# Patient Record
Sex: Male | Born: 1990
Health system: Southern US, Community
[De-identification: ages and names within clinical notes are randomized; demographics above are authoritative.]

## PROBLEM LIST (undated history)

## (undated) HISTORY — PX: APPENDECTOMY: SHX54

## (undated) HISTORY — PX: HERNIA REPAIR: SHX51

---
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
## 2012-12-18 IMAGING — CT CT ABD-PELV W/ CM
2 of 3 series · 16 of 46 positions shown, 18 images · IV contrast (APPLIED)
Comparison: None.

CLINICAL DATA: Right lower quadrant abdominal pain.  Elevated white
blood count.  Nausea and vomiting.  Fever.

CT ABDOMEN AND PELVIS WITH CONTRAST
TECHNIQUE: Multidetector CT imaging of the abdomen and pelvis was
performed following the standard protocol during bolus
administration of intravenous contrast.
Contrast:  100 ml of Fmnipaque-5OO

[Series 2: abd/pelvis 5.0 b31f · axial · 0.72mm/px · z∈[-513,-58]mm · 13 of 105 slices shown, 15 images]
[im 7/105  soft-tissue]
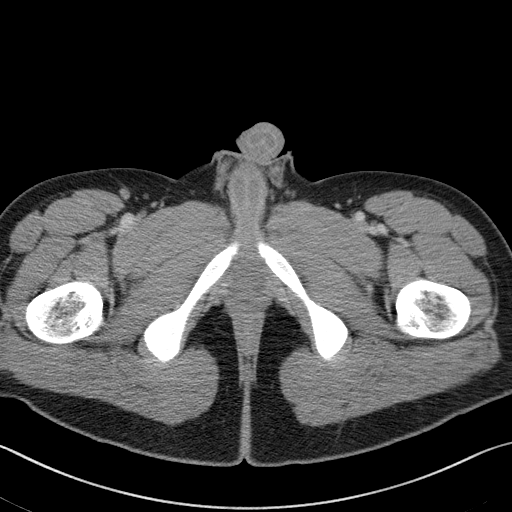
[im 7/105  bone]
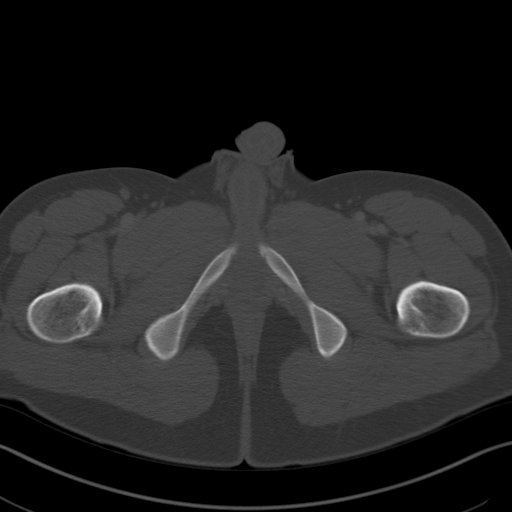
[im 14/105  soft-tissue]
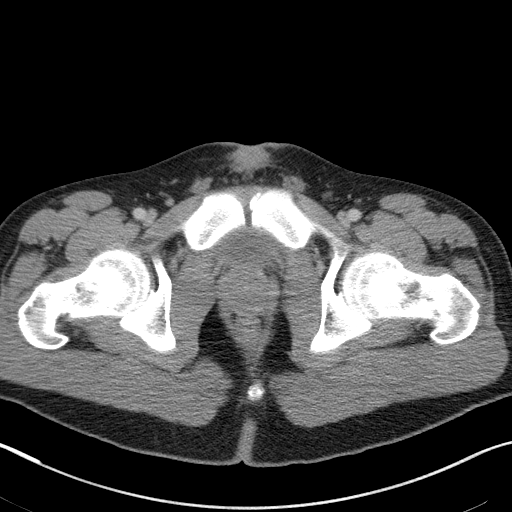
[im 21/105  soft-tissue]
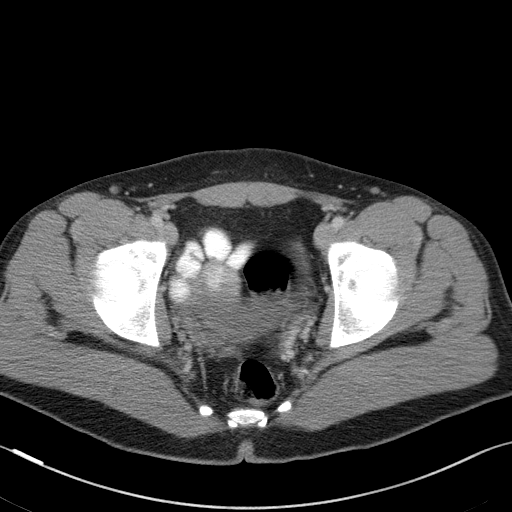
[im 31/105  soft-tissue]
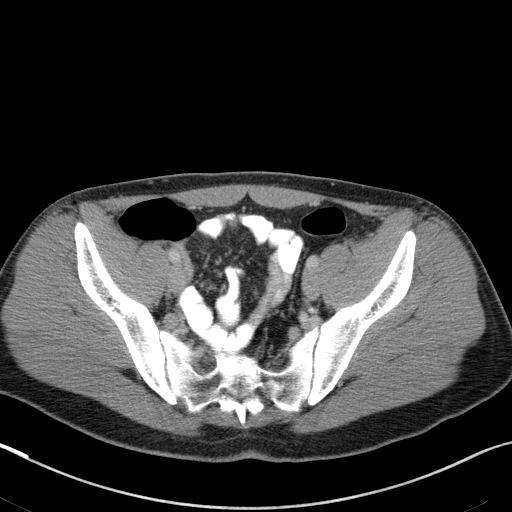
[im 37/105  soft-tissue]
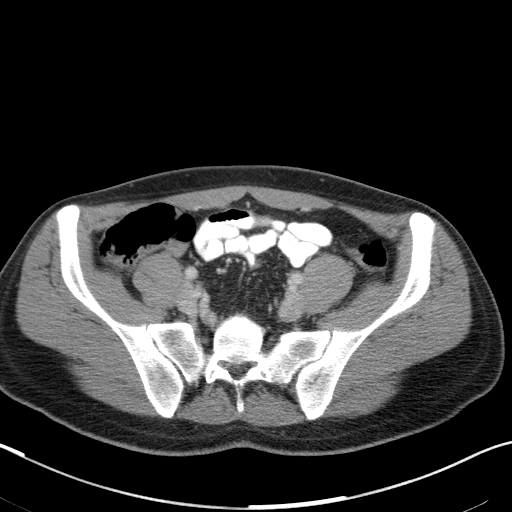
[im 44/105  soft-tissue]
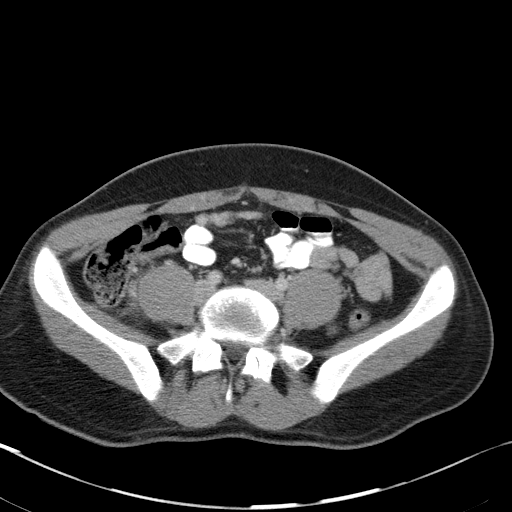
[im 54/105  soft-tissue]
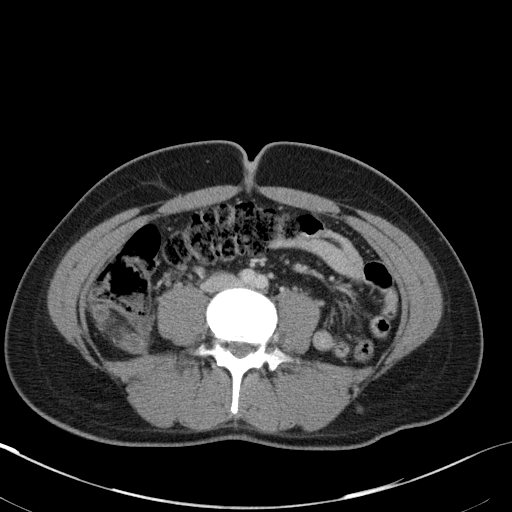
[im 61/105  soft-tissue]
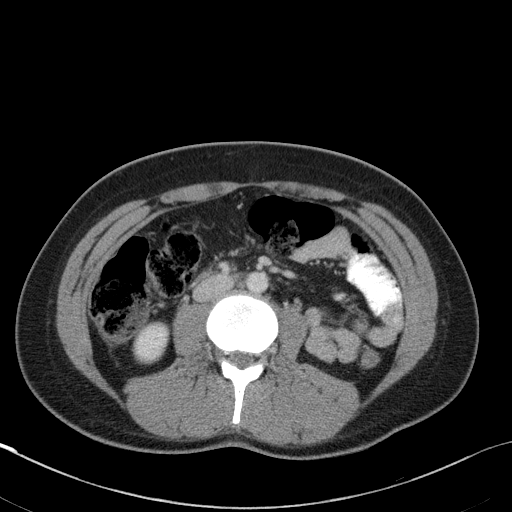
[im 68/105  soft-tissue]
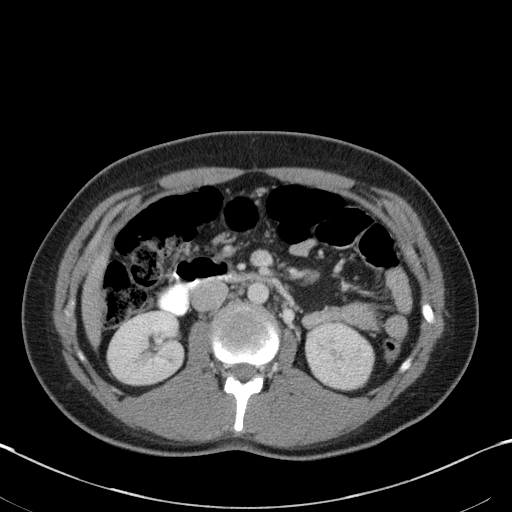
[im 68/105  bone]
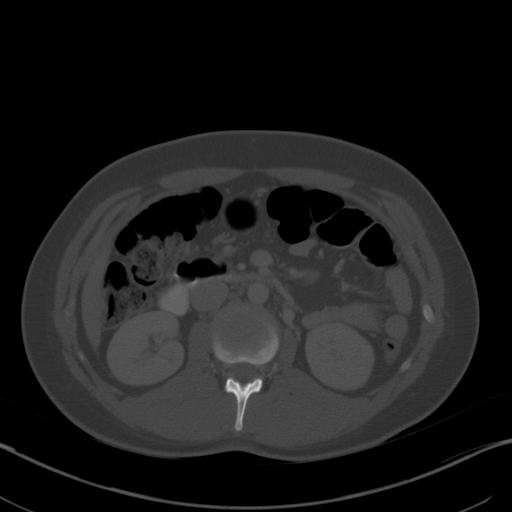
[im 74/105  soft-tissue]
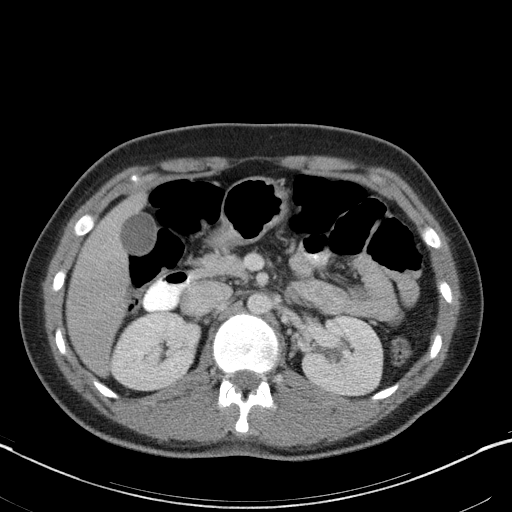
[im 84/105  soft-tissue]
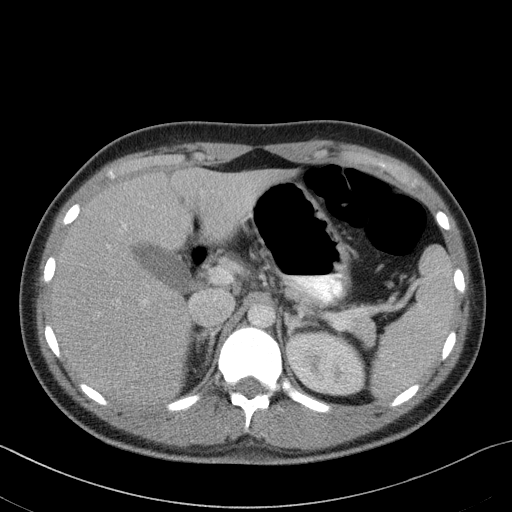
[im 91/105  soft-tissue]
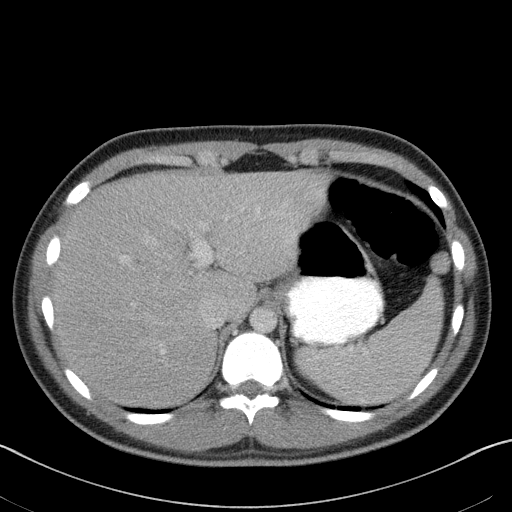
[im 98/105  soft-tissue]
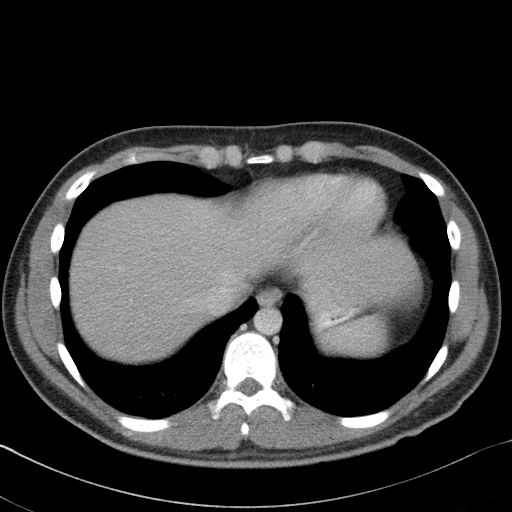

[Series 5: abd/pelvis 3.0 coronal · coronal · 0.94mm/px · 3 of 73 slices shown]
[im 25/73  soft-tissue]
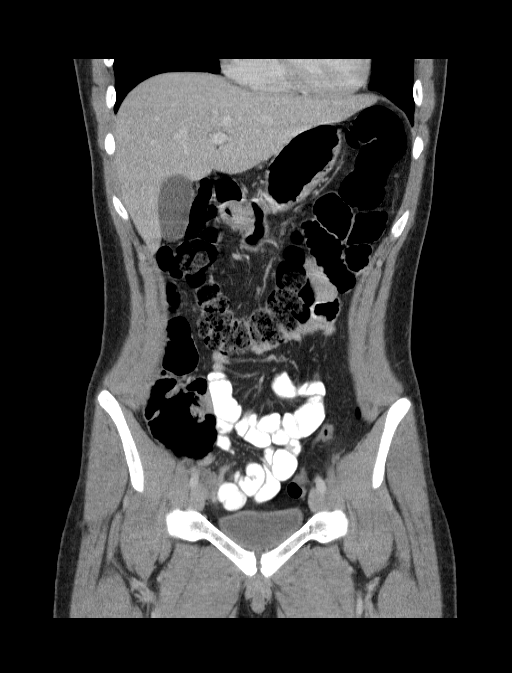
[im 33/73  soft-tissue]
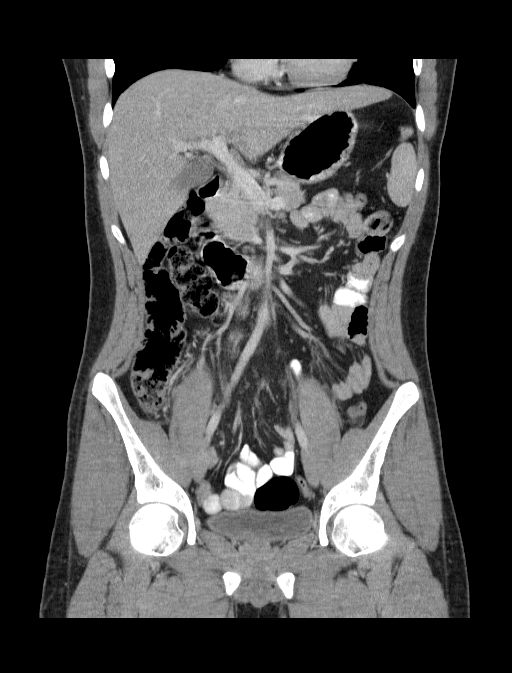
[im 41/73  soft-tissue]
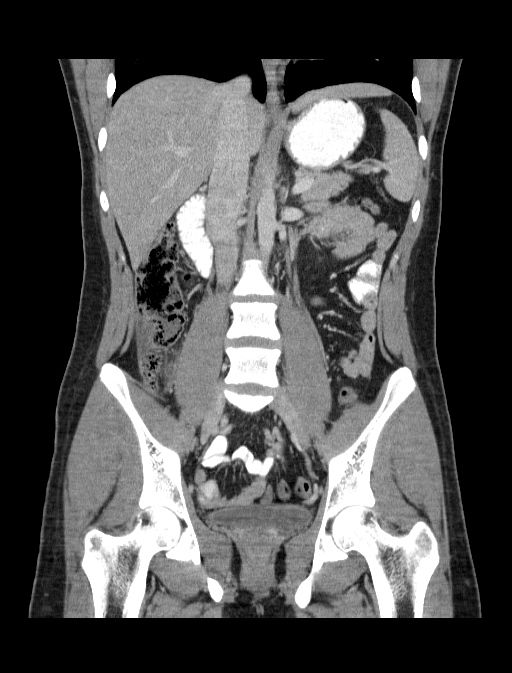

[16 of 46 positions shown; findings below may reference images not displayed]

FINDINGS: The patient has acute appendicitis with numerous
phleboliths and extensive periappendiceal inflammation with fluid
in the right pericolic gutter.  There is air within the distended
abnormal appendix.  No periappendiceal abscess or perforation.

The liver, spleen, pancreas, adrenal glands, and kidneys are
normal.  No free air or free fluid.  No acute osseous abnormality.
IMPRESSION: Acute appendicitis with prominent periappendiceal inflammation.

Critical Value/emergent results were called by telephone at the
time of interpretation on 05/18/2011  at [DATE] p.m.  to  Dr. Meron,
who verbally acknowledged these results.

## 2017-03-09 ENCOUNTER — Ambulatory Visit (HOSPITAL_COMMUNITY)
Admission: EM | Admit: 2017-03-09 | Discharge: 2017-03-09 | Disposition: A | Discharge status: Home / Self Care | Payer: 59 | Attending: Family Medicine | Admitting: Family Medicine

## 2017-03-09 ENCOUNTER — Encounter (HOSPITAL_COMMUNITY): Payer: Self-pay | Admitting: Emergency Medicine

## 2017-03-09 DIAGNOSIS — R197 Diarrhea, unspecified: Secondary | ICD-10-CM | POA: Diagnosis not present

## 2017-03-09 DIAGNOSIS — E860 Dehydration: Secondary | ICD-10-CM | POA: Diagnosis not present

## 2017-03-09 DIAGNOSIS — R112 Nausea with vomiting, unspecified: Secondary | ICD-10-CM

## 2017-03-09 MED ORDER — ONDANSETRON HCL 4 MG PO TABS: 4.0000 mg | ORAL_TABLET | Freq: Four times a day (QID) | ORAL | 0 refills | Status: AC

## 2017-03-09 NOTE — ED Provider Notes (Signed)
[facility]    CSN: [id number] Arrival date & time: 03/09/17  1146     History   Chief Complaint Chief Complaint  Patient presents with  . Emesis    HPI Xavier Johnson is a 26 y.o. male.   26 year old male comes in with mother for a one-day history of diarrhea, nausea, vomiting. He denies any abdominal pain, blood in stool, blood in urine. Denies urinary frequency, dysuria, hematuria. He has been able to keep little water down. But otherwise has not been able to eat. Denies any food intake that could be causing the problem, denies recent travel, recent antibiotic use. He also has bilateral upper and lower extremity but bites, where he states it was due to fleas from a friend's pet. 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 Patient denies chest pain, shortness of breath, wheezing, trouble swallowing, swelling of the throat. Denies fever, chills, night sweats. Endorses some dizziness and weakness when he stands.      Past Medical History:  Diagnosis Date  . Acute appendicitis with gangrene 07/21/2011    Patient Active Problem List   Diagnosis Date Noted  . Acute appendicitis with gangrene 07/21/2011    Past Surgical History:  Procedure Laterality Date  . HERNIA REPAIR    . LAPAROSCOPIC APPENDECTOMY  07/18/2011   Procedure: APPENDECTOMY LAPAROSCOPIC;  Surgeon: Ardeth Sportsman, MD;  Location: [facility];  Service: General;  Laterality: N/A;       Home Medications    Prior to Admission medications   Medication Sig Start Date End Date Taking? Authorizing Provider  ondansetron (ZOFRAN) 4 MG tablet Take 1 tablet (4 mg total) by mouth every 6 (six) hours. 03/09/17   Belinda Fisher, PA-C    Family History No family history on file.  Social History Social History  Substance Use Topics  . Smoking status: Never Smoker  . Smokeless tobacco: Not on file  . Alcohol use No     Allergies     Patient has no known allergies.   Review of Systems Review of Systems  Reason unable to perform ROS: See HPI as above.     Physical Exam Triage Vital Signs ED Triage Vitals  Enc Vitals Group     BP 03/09/17 1239 119/71     Pulse Rate 03/09/17 1239 (!) 103     Resp 03/09/17 1239 20     Temp 03/09/17 1237 98.3 F (36.8 C)     Temp Source 03/09/17 1237 Oral     SpO2 03/09/17 1239 100 %     Weight --      Height --      Head Circumference --      Peak Flow --      Pain Score 03/09/17 1236 7     Pain Loc --      Pain Edu? --      Excl. in [place]? --    Orthostatic VS for the past 24 hrs:  BP- Lying Pulse- Lying BP- Sitting Pulse- Sitting BP- Standing at 0 minutes Pulse- Standing at 0 minutes  03/09/17 1345 130/69 98 131/71 108 106/43 124    Updated Vital Signs BP 132/63 (BP Location: Left Arm)   Pulse (!) 107   Temp 99.3 F (37.4 C) (Oral)   Resp 16   SpO2 99%    Physical Exam  Constitutional: He is oriented to person, place, and time. He appears  well-developed and well-nourished. No distress.  HENT:  Head: Normocephalic and atraumatic.  Eyes: Pupils are equal, round, and reactive to light. Conjunctivae are normal.  Cardiovascular: Regular rhythm and normal heart sounds.  Tachycardia present.  Exam reveals no gallop and no friction rub.   No murmur heard. Pulmonary/Chest: Effort normal and breath sounds normal. No respiratory distress. He has no wheezes. He has no rales.  Abdominal: Soft. Bowel sounds are normal. He exhibits no distension and no mass. There is no tenderness. There is no rebound and no guarding.  Neurological: He is alert and oriented to person, place, and time.  Skin: Skin is warm and dry.     UC Treatments / Results  Labs (all labs ordered are listed, but only abnormal results are displayed) Labs Reviewed - No data to display  EKG  EKG Interpretation None       Radiology No results found.  Procedures Procedures (including critical  care time)  Medications Ordered in UC Medications - No data to display   Initial Impression / Assessment and Plan / UC Course  I have reviewed the triage vital signs and the nursing notes.  Pertinent labs & imaging results that were available during my care of the patient were reviewed by me and considered in my medical decision making (see chart for details).  Clinical Course as of Mar 10 1455  Tue Mar 09, 2017  1410 Patient orthostatics positive for dehydration. By mouth challenge in office. Patient states without nausea right now, and would like to defer Zofran treatment for now.   [AY]    Clinical Course User Index [AY] Belinda Fisher, PA-C    Patient was able to tolerate by mouth intake. Was sent home with Zofran for nausea and vomiting. Patient to push fluids. Patient to wash hands frequently to prevent mouth intake of any chemicals during his work. Return precautions given. Patient expresses understanding and agrees to plan.   Final Clinical Impressions(s) / UC Diagnoses   Final diagnoses:  Nausea vomiting and diarrhea  Dehydration    New Prescriptions New Prescriptions   ONDANSETRON (ZOFRAN) 4 MG TABLET    Take 1 tablet (4 mg total) by mouth every 6 (six) hours.      Belinda Fisher, PA-C 03/09/17 1458

## 2017-03-09 NOTE — ED Triage Notes (Addendum)
Vomiting and diarrhea since 1:00 am.  Vomiting is slowing down, diarrhea continues.  Last vomiting episode around 9:00 am  Covered in insect bites -extremities x 4

## 2017-03-09 NOTE — Discharge Instructions (Signed)
Zofran for nausea and vomiting. Keep hydrated, your urine should be clear to pale yellow in color. Follow-up bland diet for the next few days. Monitor for any worsening of symptoms, abdominal pain, nausea and vomiting not controlled by medicine, blood in stool, blood in vomit, follow up at the emergency department for further evaluation.

## 2020-01-01 ENCOUNTER — Encounter: Payer: Self-pay | Admitting: Emergency Medicine

## 2020-01-01 ENCOUNTER — Ambulatory Visit
Admission: EM | Admit: 2020-01-01 | Discharge: 2020-01-01 | Disposition: A | Discharge status: Other Acute Inpatient Hospital | Payer: BC Managed Care – PPO

## 2020-01-01 ENCOUNTER — Ambulatory Visit
Admission: EM | Admit: 2020-01-01 | Discharge: 2020-01-01 | Disposition: A | Discharge status: Home / Self Care | Payer: Self-pay | Attending: Family Medicine | Admitting: Family Medicine

## 2020-01-01 ENCOUNTER — Ambulatory Visit: Payer: Self-pay

## 2020-01-01 ENCOUNTER — Emergency Department
Admission: EM | Admit: 2020-01-01 | Discharge: 2020-01-01 | Disposition: A | Discharge status: Home / Self Care | Payer: BC Managed Care – PPO | Attending: Student | Admitting: Student

## 2020-01-01 ENCOUNTER — Other Ambulatory Visit: Payer: Self-pay

## 2020-01-01 DIAGNOSIS — L03114 Cellulitis of left upper limb: Secondary | ICD-10-CM

## 2020-01-01 DIAGNOSIS — L02414 Cutaneous abscess of left upper limb: Secondary | ICD-10-CM | POA: Diagnosis not present

## 2020-01-01 DIAGNOSIS — F17210 Nicotine dependence, cigarettes, uncomplicated: Secondary | ICD-10-CM | POA: Insufficient documentation

## 2020-01-01 DIAGNOSIS — L0291 Cutaneous abscess, unspecified: Secondary | ICD-10-CM

## 2020-01-01 LAB — CBC WITH DIFFERENTIAL/PLATELET
Abs Immature Granulocytes: 0.02 10*3/uL (ref 0.00–0.07)
Basophils Absolute: 0 10*3/uL (ref 0.0–0.1)
Basophils Relative: 0 %
Eosinophils Absolute: 0.1 10*3/uL (ref 0.0–0.5)
Eosinophils Relative: 1 %
HCT: 42.4 % (ref 39.0–52.0)
Hemoglobin: 14.8 g/dL (ref 13.0–17.0)
Immature Granulocytes: 0 %
Lymphocytes Relative: 12 %
Lymphs Abs: 1.2 10*3/uL (ref 0.7–4.0)
MCH: 30.1 pg (ref 26.0–34.0)
MCHC: 34.9 g/dL (ref 30.0–36.0)
MCV: 86.4 fL (ref 80.0–100.0)
Monocytes Absolute: 0.6 10*3/uL (ref 0.1–1.0)
Monocytes Relative: 6 %
Neutro Abs: 8.1 10*3/uL — ABNORMAL HIGH (ref 1.7–7.7)
Neutrophils Relative %: 81 %
Platelets: 202 10*3/uL (ref 150–400)
RBC: 4.91 MIL/uL (ref 4.22–5.81)
RDW: 12.6 % (ref 11.5–15.5)
WBC: 10.1 10*3/uL (ref 4.0–10.5)
nRBC: 0 % (ref 0.0–0.2)

## 2020-01-01 LAB — LACTIC ACID, PLASMA: Lactic Acid, Venous: 0.6 mmol/L (ref 0.5–1.9)

## 2020-01-01 LAB — COMPREHENSIVE METABOLIC PANEL
ALT: 18 U/L (ref 0–44)
AST: 17 U/L (ref 15–41)
Albumin: 4.4 g/dL (ref 3.5–5.0)
Alkaline Phosphatase: 50 U/L (ref 38–126)
Anion gap: 9 (ref 5–15)
BUN: 11 mg/dL (ref 6–20)
CO2: 27 mmol/L (ref 22–32)
Calcium: 8.9 mg/dL (ref 8.9–10.3)
Chloride: 105 mmol/L (ref 98–111)
Creatinine, Ser: 0.94 mg/dL (ref 0.61–1.24)
GFR calc Af Amer: >60 mL/min (ref >60)
GFR calc non Af Amer: >60 mL/min (ref >60)
Glucose, Bld: 93 mg/dL (ref 70–99)
Potassium: 4 mmol/L (ref 3.5–5.1)
Sodium: 141 mmol/L (ref 135–145)
Total Bilirubin: 0.9 mg/dL (ref 0.3–1.2)
Total Protein: 7.7 g/dL (ref 6.5–8.1)

## 2020-01-01 MED ORDER — SULFAMETHOXAZOLE-TRIMETHOPRIM 800-160 MG PO TABS
1.0000 | ORAL_TABLET | Freq: Two times a day (BID) | ORAL | 0 refills | Status: DC
Start: 2020-01-01 — End: 2020-01-01

## 2020-01-01 MED ORDER — SODIUM CHLORIDE 0.9% FLUSH: 3.0000 mL | Freq: Once | INTRAVENOUS | Status: DC

## 2020-01-01 MED ORDER — BACITRACIN ZINC 500 UNIT/GM EX OINT
TOPICAL_OINTMENT | Freq: Two times a day (BID) | CUTANEOUS | Status: DC
  Filled 2020-01-01: qty 0.9

## 2020-01-01 MED ORDER — SODIUM CHLORIDE 0.9 % IV SOLN
1.0000 g | Freq: Once | INTRAVENOUS | Status: AC
  Administered 2020-01-01: 1 g via INTRAVENOUS
  Filled 2020-01-01: qty 10

## 2020-01-01 MED ORDER — SULFAMETHOXAZOLE-TRIMETHOPRIM 800-160 MG PO TABS: 1.0000 | ORAL_TABLET | Freq: Two times a day (BID) | ORAL | 0 refills | Status: AC

## 2020-01-01 MED ORDER — CEPHALEXIN 500 MG PO CAPS: 500.0000 mg | ORAL_CAPSULE | Freq: Four times a day (QID) | ORAL | 0 refills | Status: DC

## 2020-01-01 MED ORDER — KETOROLAC TROMETHAMINE 30 MG/ML IJ SOLN
INTRAMUSCULAR | Status: AC
  Administered 2020-01-01: 30 mg via INTRAVENOUS
  Filled 2020-01-01: qty 1

## 2020-01-01 MED ORDER — CEPHALEXIN 500 MG PO CAPS: 500.0000 mg | ORAL_CAPSULE | Freq: Four times a day (QID) | ORAL | 0 refills | Status: AC

## 2020-01-01 MED ORDER — LIDOCAINE-EPINEPHRINE 2 %-1:100000 IJ SOLN
20.0000 mL | Freq: Once | INTRAMUSCULAR | Status: AC
  Administered 2020-01-01: 20 mL
  Filled 2020-01-01: qty 1

## 2020-01-01 MED ORDER — VANCOMYCIN HCL IN DEXTROSE 1-5 GM/200ML-% IV SOLN
1000.0000 mg | Freq: Once | INTRAVENOUS | Status: AC
  Administered 2020-01-01: 1000 mg via INTRAVENOUS
  Filled 2020-01-01: qty 200

## 2020-01-01 MED ORDER — KETOROLAC TROMETHAMINE 30 MG/ML IJ SOLN: 30.0000 mg | Freq: Once | INTRAMUSCULAR | Status: AC

## 2020-01-01 NOTE — ED Provider Notes (Signed)
[facility]    CSN: [id number] Arrival date & time: 01/01/20  1049      History   Chief Complaint Chief Complaint  Patient presents with   Appointment   Cellulitis    HPI MATTHEO SWINDLE is a 29 y.o. male.   Patient presents with an abscess on his left forearm with pain and redness and swelling x3 days.  No known injury.  He thinks it may have been ingrown hair or a pimple.  He has been treating the area with antibiotic ointment.  He denies fever, chills, numbness, weakness, or other symptoms.  The history is provided by the patient.    Past Medical History:  Diagnosis Date   Acute appendicitis with gangrene 07/21/2011    Patient Active Problem List   Diagnosis Date Noted   Acute appendicitis with gangrene 07/21/2011    Past Surgical History:  Procedure Laterality Date   APPENDECTOMY     HERNIA REPAIR     LAPAROSCOPIC APPENDECTOMY  07/18/2011   Procedure: APPENDECTOMY LAPAROSCOPIC;  Surgeon: Adin Hector, MD;  Location: [facility];  Service: General;  Laterality: N/A;       Home Medications    Prior to Admission medications   Medication Sig Start Date End Date Taking? Authorizing Provider  ondansetron (ZOFRAN) 4 MG tablet Take 1 tablet (4 mg total) by mouth every 6 (six) hours. 03/09/17   Ok Edwards, PA-C    Family History Family History  Problem Relation Age of Onset   Healthy Mother    Healthy Father     Social History Social History   Tobacco Use   Smoking status: Current Every Day Smoker    Packs/day: 1.00    Years: 2.00    Pack years: 2.00    Types: Cigarettes   Smokeless tobacco: Never Used  Scientific laboratory technician Use: Never used  Substance Use Topics   Alcohol use: Yes    Comment: occassional   Drug use: No     Allergies   Patient has no known allergies.   Review of Systems Review of Systems  Constitutional: Negative for chills and fever.  HENT: Negative for ear pain and sore throat.   Eyes: Negative for  pain and visual disturbance.  Respiratory: Negative for cough and shortness of breath.   Cardiovascular: Negative for chest pain and palpitations.  Gastrointestinal: Negative for abdominal pain and vomiting.  Genitourinary: Negative for dysuria and hematuria.  Musculoskeletal: Positive for arthralgias and joint swelling. Negative for back pain.  Skin: Positive for color change and wound.  Neurological: Negative for seizures, syncope, weakness and numbness.  All other systems reviewed and are negative.    Physical Exam Triage Vital Signs ED Triage Vitals  Enc Vitals Group     BP 01/01/20 1100 (!) 156/82     Pulse Rate 01/01/20 1100 (!) 54     Resp 01/01/20 1100 18     Temp 01/01/20 1100 98.9 F (37.2 C)     Temp Source 01/01/20 1100 Oral     SpO2 01/01/20 1100 98 %     Weight 01/01/20 1101 250 lb (113.4 kg)     Height 01/01/20 1101 6\' 5"  (1.956 m)     Head Circumference --      Peak Flow --      Pain Score 01/01/20 1100 2     Pain Loc --      Pain Edu? --      Excl. in [place]? --  No data found.  Updated Vital Signs BP (!) 156/82 (BP Location: Right Arm)    Pulse (!) 54    Temp 98.9 F (37.2 C) (Oral)    Resp 18    Ht 6\' 5"  (1.956 m)    Wt 250 lb (113.4 kg)    SpO2 98%    BMI 29.65 kg/m   Visual Acuity Right Eye Distance:   Left Eye Distance:   Bilateral Distance:    Right Eye Near:   Left Eye Near:    Bilateral Near:     Physical Exam Vitals and nursing note reviewed.  Constitutional:      Appearance: He is well-developed.  HENT:     Head: Normocephalic and atraumatic.     Mouth/Throat:     Mouth: Mucous membranes are moist.  Eyes:     Conjunctiva/sclera: Conjunctivae normal.  Cardiovascular:     Rate and Rhythm: Normal rate and regular rhythm.     Heart sounds: No murmur heard.   Pulmonary:     Effort: Pulmonary effort is normal. No respiratory distress.     Breath sounds: Normal breath sounds.  Abdominal:     Palpations: Abdomen is soft.      Tenderness: There is no abdominal tenderness.  Musculoskeletal:        General: Swelling present.     Cervical back: Neck supple.  Skin:    General: Skin is warm and dry.     Findings: Erythema present.     Comments: Left forearm and elbow erythematous and edematous.  22 cm x 12 cm area of erythema with central lesion.  See pictures for details.    Neurological:     General: No focal deficit present.     Mental Status: He is alert and oriented to person, place, and time.     Sensory: No sensory deficit.     Motor: No weakness.     Gait: Gait normal.            UC Treatments / Results  Labs (all labs ordered are listed, but only abnormal results are displayed) Labs Reviewed - No data to display  EKG   Radiology No results found.  Procedures Procedures (including critical care time)  Medications Ordered in UC Medications - No data to display  Initial Impression / Assessment and Plan / UC Course  I have reviewed the triage vital signs and the nursing notes.  Pertinent labs & imaging results that were available during my care of the patient were reviewed by me and considered in my medical decision making (see chart for details).   Cellulitis of left arm.  Sending patient to the ED for evaluation due to the possible need for lab work and IV antibiotics.      Final Clinical Impressions(s) / UC Diagnoses   Final diagnoses:  Cellulitis of left arm     Discharge Instructions     Go to the Emergency Department.        ED Prescriptions    None     PDMP not reviewed this encounter.   , NP 01/01/20 1128

## 2020-01-01 NOTE — ED Triage Notes (Signed)
Patient is being discharged from the Urgent Care and sent to the Emergency Department via POV . Per Wendee Beavers, NP, patient is in need of higher level of care due to septic joint. Patient is aware and verbalizes understanding of plan of care.  Vitals:   01/01/20 1100  BP: (!) 156/82  Pulse: (!) 54  Resp: 18  Temp: 98.9 F (37.2 C)  SpO2: 98%

## 2020-01-01 NOTE — Progress Notes (Signed)
PHARMACY -  BRIEF ANTIBIOTIC NOTE   Pharmacy has received consult(s) for Vancomycin from an ED provider.  The patient's profile has been reviewed for ht/wt/allergies/indication/available labs.    One time order(s) placed for Vancomycin 1g IV x 1 dose  Further antibiotics/pharmacy consults should be ordered by admitting physician if indicated.                       Thank you, Bettey Costa 01/01/2020  2:43 PM

## 2020-01-01 NOTE — Discharge Instructions (Signed)
Thank you for letting us take care of you in the emergency department today.   Please continue to take any regular, prescribed medications.   New medications we have prescribed:  Keflex, antibiotic Bactrim, antibiotic   Please follow up with: A primary care doctor to review your ER visit and follow up on your symptoms. Go for a wound recheck in 2-3 days.   Please return to the ER for any new or worsening symptoms. This includes worsening or spreading redness, worsening pain/swelling, numbness, tingling, pain w/ movement of your elbow, swelling/redness of the elbow, or any other symptoms concerning to you.

## 2020-01-01 NOTE — ED Triage Notes (Signed)
swelling to right fore arm x 3 days. Seen by urgent care and sent to ED for further evaluation.  Area reddened and warm to touch.

## 2020-01-01 NOTE — Discharge Instructions (Addendum)
Go to the Emergency Department. 

## 2020-01-01 NOTE — ED Provider Notes (Signed)
[facility] Emergency Department Provider Note  ____________________________________________   First MD Initiated Contact with Patient 01/01/20 1420     (approximate)  I have reviewed the triage vital signs and the nursing notes.  History  Chief Complaint Abscess    HPI Xavier Johnson is a 29 y.o. male otherwise healthy, who presents to the ER for evaluation cellulitis and possible abscess to the left arm.  Patient states he first noticed a small bump/raised area on Friday.  Since then he has developed spreading erythema and some associated swelling to the forearm and proximal arm.  Some mild associated discomfort, described as a soreness/tightness.  No radiation.  No alleviating/aggravating components.  Denies any known injury, suspects it may have started as an ingrown hair or pimple.  Has been treating it with an antibiotic ointment without improvement.  No fevers, nausea, vomiting.  Has full range of motion at the elbow and wrist without discomfort.  No numbness or tingling.   Past Medical Hx Past Medical History:  Diagnosis Date  . Acute appendicitis with gangrene 07/21/2011    Problem List Patient Active Problem List   Diagnosis Date Noted  . Acute appendicitis with gangrene 07/21/2011    Past Surgical Hx Past Surgical History:  Procedure Laterality Date  . APPENDECTOMY    . HERNIA REPAIR    . LAPAROSCOPIC APPENDECTOMY  07/18/2011   Procedure: APPENDECTOMY LAPAROSCOPIC;  Surgeon: Adin Hector, MD;  Location: [facility];  Service: General;  Laterality: N/A;    Medications Prior to Admission medications   Medication Sig Start Date End Date Taking? Authorizing Provider  ondansetron (ZOFRAN) 4 MG tablet Take 1 tablet (4 mg total) by mouth every 6 (six) hours. 03/09/17   Ok Edwards, PA-C    Allergies Patient has no known allergies.  Family Hx Family History  Problem Relation Age of Onset  . Healthy Mother   . Healthy Father      Social Hx Social History   Tobacco Use  . Smoking status: Current Every Day Smoker    Packs/day: 1.00    Years: 2.00    Pack years: 2.00    Types: Cigarettes  . Smokeless tobacco: Never Used  Vaping Use  . Vaping Use: Never used  Substance Use Topics  . Alcohol use: Yes    Comment: occassional  . Drug use: No     Review of Systems  Constitutional: Negative for fever. Negative for chills. Eyes: Negative for visual changes. ENT: Negative for sore throat. Cardiovascular: Negative for chest pain. Respiratory: Negative for shortness of breath. Gastrointestinal: Negative for nausea. Negative for vomiting.  Genitourinary: Negative for dysuria. Musculoskeletal: Negative for leg swelling. Skin: + cellulitis Neurological: Negative for headaches.   Physical Exam  Vital Signs: ED Triage Vitals  Enc Vitals Group     BP 01/01/20 1306 140/77     Pulse Rate 01/01/20 1306 (!) 57     Resp 01/01/20 1306 18     Temp 01/01/20 1306 99 F (37.2 C)     Temp Source 01/01/20 1306 Oral     SpO2 01/01/20 1306 100 %     Weight 01/01/20 1301 250 lb (113.4 kg)     Height 01/01/20 1301 6\' 5"  (1.956 m)     Head Circumference --      Peak Flow --      Pain Score 01/01/20 1300 3     Pain Loc --      Pain Edu? --  Excl. in GC? --     Constitutional: Alert and oriented. Well appearing. NAD.  Head: Normocephalic. Atraumatic. Eyes: Conjunctivae clear. Sclera anicteric. Pupils equal and symmetric. Nose: No masses or lesions. No congestion or rhinorrhea. Mouth/Throat: Wearing mask.  Neck: No stridor. Trachea midline.  Cardiovascular: Normal rate, regular rhythm. Extremities well perfused.  Fingers warm and well-perfused. Respiratory: Normal respiratory effort.  Genitourinary: Deferred. Musculoskeletal: LUE - 2 x 2 cm raised area of erythema, induration, w/ central crusting to the lateral aspect of the proximal forearm. Surrounding erythema and induration that spreads distally to the  mid forearm and proximally to the mid humerus area, lateral aspect.  Erythema does encompass the lateral elbow, but there is no joint effusion or warmth.  Full range of motion at the elbow without discomfort.  Compartments soft and compressible.  2+ radial pulse, cap refill less than 2 seconds, fingers warm and well-perfused.  Motor/sensation intact in radial, ulnar, median distribution. 5/5 biceps, triceps, grip strength. Neurologic:  Normal speech and language. No gross focal or lateralizing neurologic deficits are appreciated.  As above. Skin: As above. Psychiatric: Mood and affect are appropriate for situation.   Radiology  Personally reviewed available imaging myself.   Bedside ultrasound does reveal a small fluid pocket that appears amenable to drainage.  Patient agreeable to I&D.   Procedures  Procedure(s) performed (including critical care):  Marland KitchenMarland KitchenIncision and Drainage  Date/Time: 01/01/2020 4:15 PM Performed by: Miguel Aschoff., MD Authorized by: Miguel Aschoff., MD   Consent:    Consent obtained:  Verbal   Consent given by:  Patient   Risks discussed:  Bleeding, incomplete drainage and pain   Alternatives discussed:  Delayed treatment Location:    Type:  Abscess   Location:  Upper extremity   Upper extremity location:  Arm   Arm location:  L lower arm Pre-procedure details:    Skin preparation:  Betadine Anesthesia (see MAR for exact dosages):    Anesthesia method:  Local infiltration   Local anesthetic:  Lidocaine 2% WITH epi Procedure type:    Complexity:  Complex Procedure details:    Incision types:  Stab incision   Scalpel blade:  11   Wound management:  Probed and deloculated and irrigated with saline   Drainage:  Bloody and purulent   Drainage amount:  Moderate   Packing materials:  None Post-procedure details:    Patient tolerance of procedure:  Tolerated well, no immediate complications Comments:     Dressed with bacitracin ointment and  gauze     Initial Impression / Assessment and Plan / MDM / ED Course  29 y.o. male who presents to the ED for cellulitis of the left arm, as above.  Ddx: cellulitis, abscess.  Patient does not have any joint effusion at the elbow, retains full range of motion without discomfort and neurovascularly intact.  Do not suspect infected joint.  Will plan for labs, bedside ultrasound to evaluate for potential drainable abscess, dose of IV antibiotics  Normal WBC count.  Normal lactic acid.  Bedside ultrasound does reveal a small pocket of fluid that appears amenable to drainage.  Patient agreeable to I&D, tolerated well, see above.  Received dose of IV antibiotics here and will plan for RX for oral antibiotic course.  Discussed wound care and PCP follow-up this week for wound recheck.  Discussed strict return precautions including signs/symptoms of joint infection.  Patient and mom at bedside voiced understanding and are comfortable with the plan.   _______________________________  As part of my medical decision making I have reviewed available labs, radiology tests, reviewed old records/performed chart review.   Final Clinical Impression(s) / ED Diagnosis  Cellulitis of left arm    Note:  This document was prepared using Dragon voice recognition software and may include unintentional dictation errors.   Miguel Aschoff., MD 01/01/20 [phone]

## 2020-01-01 NOTE — ED Notes (Signed)
Full rainbow collected in triage and first set of cultures.

## 2020-01-01 NOTE — ED Triage Notes (Signed)
Patient in today c/o left arm pain, swelling and redness around an area that patient thinks started out as an ingrown hair or pimple 3 days ago. Patient has been using OTC antibiotic ointment.

## 2020-01-01 NOTE — ED Notes (Signed)
E-signature not working at this time. Pt verbalized understanding of D/C instructions, prescriptions and follow up care with no further questions at this time. Pt in NAD and ambulatory at time of D/C.  

## 2023-04-20 ENCOUNTER — Ambulatory Visit
Admission: EM | Admit: 2023-04-20 | Discharge: 2023-04-20 | Disposition: A | Discharge status: Home / Self Care | Payer: BC Managed Care – PPO | Attending: Emergency Medicine | Admitting: Emergency Medicine

## 2023-04-20 DIAGNOSIS — S90569A Insect bite (nonvenomous), unspecified ankle, initial encounter: Secondary | ICD-10-CM

## 2023-04-20 DIAGNOSIS — L03115 Cellulitis of right lower limb: Secondary | ICD-10-CM | POA: Diagnosis not present

## 2023-04-20 DIAGNOSIS — W57XXXA Bitten or stung by nonvenomous insect and other nonvenomous arthropods, initial encounter: Secondary | ICD-10-CM | POA: Diagnosis not present

## 2023-04-20 MED ORDER — CEPHALEXIN 500 MG PO CAPS: 500.0000 mg | ORAL_CAPSULE | Freq: Four times a day (QID) | ORAL | 0 refills | Status: AC

## 2023-04-20 NOTE — ED Triage Notes (Signed)
Patient to Urgent Care with complaints of a rash present to bilateral ankles.   Symptoms started this morning. Developed some pain and swelling over today. Reports possible insect bites. Had been outside last night.   Has had some drainage.

## 2023-04-20 NOTE — ED Provider Notes (Signed)
Xavier Johnson    CSN: [id number] Arrival date & time: 04/20/23  1632      History   Chief Complaint Chief Complaint  Patient presents with   Rash    HPI Xavier Johnson is a 32 y.o. male.  Patient presents with insect bites on both ankles x 1 day.  The insect bites are painful.  He developed swelling and redness of his ankles today.  One of the bites has had drainage.  He denies fever, chills, or other symptoms.  No treatments attempted.    The history is provided by the patient and medical records.    Past Medical History:  Diagnosis Date   Acute appendicitis with gangrene 07/21/2011    Patient Active Problem List   Diagnosis Date Noted   Acute appendicitis 07/21/2011    Past Surgical History:  Procedure Laterality Date   APPENDECTOMY     HERNIA REPAIR     LAPAROSCOPIC APPENDECTOMY  07/18/2011   Procedure: APPENDECTOMY LAPAROSCOPIC;  Surgeon: Ardeth Sportsman, MD;  Location: [facility];  Service: General;  Laterality: N/A;       Home Medications    Prior to Admission medications   Medication Sig Start Date End Date Taking? Authorizing Provider  cephALEXin (KEFLEX) 500 MG capsule Take 1 capsule (500 mg total) by mouth 4 (four) times daily. 04/20/23  Yes Mickie Bail, NP  ondansetron (ZOFRAN) 4 MG tablet Take 1 tablet (4 mg total) by mouth every 6 (six) hours. 03/09/17   Belinda Fisher, PA-C    Family History Family History  Problem Relation Age of Onset   Healthy Mother    Healthy Father     Social History Social History   Tobacco Use   Smoking status: Every Day    Current packs/day: 1.00    Average packs/day: 1 pack/day for 2.0 years (2.0 ttl pk-yrs)    Types: Cigarettes   Smokeless tobacco: Never  Vaping Use   Vaping status: Never Used  Substance Use Topics   Alcohol use: Yes    Comment: occassional   Drug use: No     Allergies   Patient has no known allergies.   Review of Systems Review of Systems  Constitutional:  Negative for  chills and fever.  HENT:  Negative for sore throat, trouble swallowing and voice change.   Respiratory:  Negative for cough and shortness of breath.   Musculoskeletal:  Positive for joint swelling. Negative for arthralgias and gait problem.  Skin:  Positive for color change and wound.  Neurological:  Negative for weakness and numbness.     Physical Exam Triage Vital Signs ED Triage Vitals  Encounter Vitals Group     BP 04/20/23 1727 127/82     Systolic BP Percentile --      Diastolic BP Percentile --      Pulse Rate 04/20/23 1722 73     Resp 04/20/23 1722 18     Temp 04/20/23 1722 98.9 F (37.2 C)     Temp src --      SpO2 04/20/23 1722 96 %     Weight --      Height --      Head Circumference --      Peak Flow --      Pain Score 04/20/23 1723 2     Pain Loc --      Pain Education --      Exclude from Growth Chart --    No data  found.  Updated Vital Signs BP 127/82   Pulse 73   Temp 98.9 F (37.2 C)   Resp 18   SpO2 96%   Visual Acuity Right Eye Distance:   Left Eye Distance:   Bilateral Distance:    Right Eye Near:   Left Eye Near:    Bilateral Near:     Physical Exam Constitutional:      General: He is not in acute distress. HENT:     Mouth/Throat:     Mouth: Mucous membranes are moist.  Cardiovascular:     Rate and Rhythm: Normal rate and regular rhythm.  Pulmonary:     Effort: Pulmonary effort is normal. No respiratory distress.  Musculoskeletal:        General: Swelling present. No deformity. Normal range of motion.  Skin:    General: Skin is warm and dry.     Findings: Erythema and lesion present.     Comments: Several open and scabbed lesions on bilateral ankles with edema; surrounding erythema on right ankle.   Neurological:     General: No focal deficit present.     Mental Status: He is alert and oriented to person, place, and time.     Sensory: No sensory deficit.     Motor: No weakness.     Gait: Gait normal.  Psychiatric:         Mood and Affect: Mood normal.        Behavior: Behavior normal.      UC Treatments / Results  Labs (all labs ordered are listed, but only abnormal results are displayed) Labs Reviewed - No data to display  EKG   Radiology No results found.  Procedures Procedures (including critical care time)  Medications Ordered in UC Medications - No data to display  Initial Impression / Assessment and Plan / UC Course  I have reviewed the triage vital signs and the nursing notes.  Pertinent labs & imaging results that were available during my care of the patient were reviewed by me and considered in my medical decision making (see chart for details).    Cellulitis of right ankle, insect bites of both ankles.  Afebrile and vital signs are stable.  Treating with cephalexin and Zyrtec.  Instructed patient to follow-up with his PCP tomorrow.  ED precautions given.  Education provided on cellulitis and insect bites.  Patient agrees to plan of care.  Final Clinical Impressions(s) / UC Diagnoses   Final diagnoses:  Cellulitis of right ankle  Insect bite of ankle, unspecified laterality, initial encounter     Discharge Instructions      Take the cephalexin as directed.  Take Zyrtec as directed.  Follow up with your primary care provider tomorrow.  Go to the emergency department if you have worsening symptoms.        ED Prescriptions     Medication Sig Dispense Auth. Provider   cephALEXin (KEFLEX) 500 MG capsule Take 1 capsule (500 mg total) by mouth 4 (four) times daily. 28 capsule Mickie Bail, NP      PDMP not reviewed this encounter.   Mickie Bail, NP 04/20/23 [phone]

## 2023-04-20 NOTE — Discharge Instructions (Addendum)
Take the cephalexin as directed.  Take Zyrtec as directed.  Follow up with your primary care provider tomorrow.  Go to the emergency department if you have worsening symptoms.
